# Patient Record
Sex: Female | Born: 1950 | Race: Black or African American | Hispanic: No | Smoking: Never smoker
Health system: Southern US, Community
[De-identification: ages and names within clinical notes are randomized; demographics above are authoritative.]

## PROBLEM LIST (undated history)

## (undated) DIAGNOSIS — I10 Essential (primary) hypertension: Secondary | ICD-10-CM

## (undated) DIAGNOSIS — J45909 Unspecified asthma, uncomplicated: Secondary | ICD-10-CM

## (undated) HISTORY — PX: ABDOMINAL HYSTERECTOMY: SHX81

---

## 2004-05-28 ENCOUNTER — Ambulatory Visit: Payer: Self-pay | Admitting: Internal Medicine

## 2005-08-01 ENCOUNTER — Ambulatory Visit: Payer: Self-pay | Admitting: Internal Medicine

## 2007-01-14 ENCOUNTER — Ambulatory Visit: Payer: Self-pay

## 2008-03-30 ENCOUNTER — Ambulatory Visit: Payer: Self-pay

## 2009-09-03 ENCOUNTER — Emergency Department: Payer: Self-pay | Admitting: Emergency Medicine

## 2009-09-26 ENCOUNTER — Ambulatory Visit: Payer: Self-pay | Admitting: Gastroenterology

## 2009-10-24 ENCOUNTER — Ambulatory Visit: Payer: Self-pay | Admitting: Internal Medicine

## 2012-12-08 ENCOUNTER — Ambulatory Visit: Payer: Self-pay | Admitting: Internal Medicine

## 2014-03-02 ENCOUNTER — Ambulatory Visit: Payer: Self-pay | Admitting: Internal Medicine

## 2014-12-26 ENCOUNTER — Other Ambulatory Visit: Payer: Self-pay | Admitting: Internal Medicine

## 2014-12-26 DIAGNOSIS — Z1231 Encounter for screening mammogram for malignant neoplasm of breast: Secondary | ICD-10-CM

## 2015-01-12 ENCOUNTER — Emergency Department: Payer: Medicare HMO

## 2015-01-12 ENCOUNTER — Emergency Department
Admission: EM | Admit: 2015-01-12 | Discharge: 2015-01-12 | Disposition: A | Discharge status: Home / Self Care | Payer: Medicare HMO | Attending: Emergency Medicine | Admitting: Emergency Medicine

## 2015-01-12 DIAGNOSIS — M1990 Unspecified osteoarthritis, unspecified site: Secondary | ICD-10-CM | POA: Diagnosis not present

## 2015-01-12 DIAGNOSIS — I10 Essential (primary) hypertension: Secondary | ICD-10-CM | POA: Diagnosis not present

## 2015-01-12 DIAGNOSIS — R609 Edema, unspecified: Secondary | ICD-10-CM | POA: Insufficient documentation

## 2015-01-12 DIAGNOSIS — M7989 Other specified soft tissue disorders: Secondary | ICD-10-CM

## 2015-01-12 HISTORY — DX: Essential (primary) hypertension: I10

## 2015-01-12 HISTORY — DX: Unspecified asthma, uncomplicated: J45.909

## 2015-01-12 LAB — CBC
HCT: 39.8 % (ref 35.0–47.0)
Hemoglobin: 12.9 g/dL (ref 12.0–16.0)
MCH: 28.6 pg (ref 26.0–34.0)
MCHC: 32.5 g/dL (ref 32.0–36.0)
MCV: 87.8 fL (ref 80.0–100.0)
Platelets: 322 10*3/uL (ref 150–440)
RBC: 4.53 MIL/uL (ref 3.80–5.20)
RDW: 14.9 % — AB (ref 11.5–14.5)
WBC: 8.1 10*3/uL (ref 3.6–11.0)

## 2015-01-12 LAB — BASIC METABOLIC PANEL
Anion gap: 10 (ref 5–15)
BUN: 12 mg/dL (ref 6–20)
CALCIUM: 9.5 mg/dL (ref 8.9–10.3)
CO2: 30 mmol/L (ref 22–32)
Chloride: 100 mmol/L — ABNORMAL LOW (ref 101–111)
Creatinine, Ser: 0.79 mg/dL (ref 0.44–1.00)
GFR calc Af Amer: >60 mL/min (ref >60)
Glucose, Bld: 119 mg/dL — ABNORMAL HIGH (ref 65–99)
Potassium: 3.9 mmol/L (ref 3.5–5.1)
Sodium: 140 mmol/L (ref 135–145)

## 2015-01-12 MED ORDER — OXYCODONE-ACETAMINOPHEN 5-325 MG PO TABS
2.0000 | ORAL_TABLET | Freq: Once | ORAL | Status: AC
  Administered 2015-01-12: 2 via ORAL
  Filled 2015-01-12: qty 2

## 2015-01-12 MED ORDER — MELOXICAM 7.5 MG PO TABS
7.5000 mg | ORAL_TABLET | Freq: Every day | ORAL | Status: AC
Start: 2015-01-12 — End: 2016-01-12

## 2015-01-12 NOTE — Discharge Instructions (Signed)
Arthritis, Nonspecific °Arthritis is inflammation of a joint. This usually means pain, redness, warmth or swelling are present. One or more joints may be involved. There are a number of types of arthritis. Your caregiver may not be able to tell what type of arthritis you have right away. °CAUSES  °The most common cause of arthritis is the wear and tear on the joint (osteoarthritis). This causes damage to the cartilage, which can break down over time. The knees, hips, back and neck are most often affected by this type of arthritis. °Other types of arthritis and common causes of joint pain include: °· Sprains and other injuries near the joint. Sometimes minor sprains and injuries cause pain and swelling that develop hours later. °· Rheumatoid arthritis. This affects hands, feet and knees. It usually affects both sides of your body at the same time. It is often associated with chronic ailments, fever, weight loss and general weakness. °· Crystal arthritis. Gout and pseudo gout can cause occasional acute severe pain, redness and swelling in the foot, ankle, or knee. °· Infectious arthritis. Bacteria can get into a joint through a break in overlying skin. This can cause infection of the joint. Bacteria and viruses can also spread through the blood and affect your joints. °· Drug, infectious and allergy reactions. Sometimes joints can become mildly painful and slightly swollen with these types of illnesses. °SYMPTOMS  °· Pain is the main symptom. °· Your joint or joints can also be red, swollen and warm or hot to the touch. °· You may have a fever with certain types of arthritis, or even feel overall ill. °· The joint with arthritis will hurt with movement. Stiffness is present with some types of arthritis. °DIAGNOSIS  °Your caregiver will suspect arthritis based on your description of your symptoms and on your exam. Testing may be needed to find the type of arthritis: °· Blood and sometimes urine tests. °· X-ray tests  and sometimes CT or MRI scans. °· Removal of fluid from the joint (arthrocentesis) is done to check for bacteria, crystals or other causes. Your caregiver (or a specialist) will numb the area over the joint with a local anesthetic, and use a needle to remove joint fluid for examination. This procedure is only minimally uncomfortable. °· Even with these tests, your caregiver may not be able to tell what kind of arthritis you have. Consultation with a specialist (rheumatologist) may be helpful. °TREATMENT  °Your caregiver will discuss with you treatment specific to your type of arthritis. If the specific type cannot be determined, then the following general recommendations may apply. °Treatment of severe joint pain includes: °· Rest. °· Elevation. °· Anti-inflammatory medication (for example, ibuprofen) may be prescribed. Avoiding activities that cause increased pain. °· Only take over-the-counter or prescription medicines for pain and discomfort as recommended by your caregiver. °· Cold packs over an inflamed joint may be used for 10 to 15 minutes every hour. Hot packs sometimes feel better, but do not use overnight. Do not use hot packs if you are diabetic without your caregiver's permission. °· A cortisone shot into arthritic joints may help reduce pain and swelling. °· Any acute arthritis that gets worse over the next 1 to 2 days needs to be looked at to be sure there is no joint infection. °Long-term arthritis treatment involves modifying activities and lifestyle to reduce joint stress jarring. This can include weight loss. Also, exercise is needed to nourish the joint cartilage and remove waste. This helps keep the muscles   around the joint strong. °HOME CARE INSTRUCTIONS  °· Do not take aspirin to relieve pain if gout is suspected. This elevates uric acid levels. °· Only take over-the-counter or prescription medicines for pain, discomfort or fever as directed by your caregiver. °· Rest the joint as much as  possible. °· If your joint is swollen, keep it elevated. °· Use crutches if the painful joint is in your leg. °· Drinking plenty of fluids may help for certain types of arthritis. °· Follow your caregiver's dietary instructions. °· Try low-impact exercise such as: °¨ Swimming. °¨ Water aerobics. °¨ Biking. °¨ Walking. °· Morning stiffness is often relieved by a warm shower. °· Put your joints through regular range-of-motion. °SEEK MEDICAL CARE IF:  °· You do not feel better in 24 hours or are getting worse. °· You have side effects to medications, or are not getting better with treatment. °SEEK IMMEDIATE MEDICAL CARE IF:  °· You have a fever. °· You develop severe joint pain, swelling or redness. °· Many joints are involved and become painful and swollen. °· There is severe back pain and/or leg weakness. °· You have loss of bowel or bladder control. °Document Released: 07/25/2004 Document Revised: 09/09/2011 Document Reviewed: 08/10/2008 °[employer]® Patient Information ©2015 [employer]. This information is not intended to replace advice given to you by your health care provider. Make sure you discuss any questions you have with your health care provider. ° °Edema °Edema is an abnormal buildup of fluids. It is more common in your legs and thighs. Painless swelling of the feet and ankles is more likely as a person ages. It also is common in looser skin, like around your eyes. °HOME CARE  °· Keep the affected body part above the level of the heart while lying down. °· Do not sit still or stand for a long time. °· Do not put anything right under your knees when you lie down. °· Do not wear tight clothes on your upper legs. °· Exercise your legs to help the puffiness (swelling) go down. °· Wear elastic bandages or support stockings as told by your doctor. °· A low-salt diet may help lessen the puffiness. °· Only take medicine as told by your doctor. °GET HELP IF: °· Treatment is not working. °· You have heart, liver,  or kidney disease and notice that your skin looks puffy or shiny. °· You have puffiness in your legs that does not get better when you raise your legs. °· You have sudden weight gain for no reason. °GET HELP RIGHT AWAY IF:  °· You have shortness of breath or chest pain. °· You cannot breathe when you lie down. °· You have pain, redness, or warmth in the areas that are puffy. °· You have heart, liver, or kidney disease and get edema all of a sudden. °· You have a fever and your symptoms get worse all of a sudden. °MAKE SURE YOU:  °· Understand these instructions. °· Will watch your condition. °· Will get help right away if you are not doing well or get worse. °Document Released: 12/04/2007 Document Revised: 06/22/2013 Document Reviewed: 04/09/2013 °[employer]Care® Patient Information ©2015 [employer]. This information is not intended to replace advice given to you by your health care provider. Make sure you discuss any questions you have with your health care provider. ° °

## 2015-01-12 NOTE — ED Provider Notes (Signed)
[facility] Emergency Department Provider Note     Time seen: ----------------------------------------- 8:56 PM on 01/12/2015 -----------------------------------------    I have reviewed the triage vital signs and the nursing notes.   HISTORY  Chief Complaint Leg Swelling    HPI Sandra Grant is a 64 y.o. female who presents ER for right knee and right leg swelling with some pain over the last several days. Patient has not had any injury, has been walking more than normal on the concrete floor. She does not have any fever or redness or warmth, does describe some pain to the posterior aspect of the right knee. Patient denies any other complaints.Pain is mild to moderate this time.   Past Medical History  Diagnosis Date  . Asthma   . Hypertension     There are no active problems to display for this patient.   Past Surgical History  Procedure Laterality Date  . Abdominal hysterectomy      Allergies Review of patient's allergies indicates not on file.  Social History History  Substance Use Topics  . Smoking status: Not on file  . Smokeless tobacco: Not on file  . Alcohol Use: No    Review of Systems Constitutional: Negative for fever. Eyes: Negative for visual changes. ENT: Negative for sore throat. Cardiovascular: Negative for chest pain. Respiratory: Negative for shortness of breath. Gastrointestinal: Negative for abdominal pain, vomiting and diarrhea. Genitourinary: Negative for dysuria. Musculoskeletal: Negative for back pain. Positive for right knee and right leg pain and swelling Skin: Negative for rash. Neurological: Negative for headaches, focal weakness or numbness.  10-point ROS otherwise negative.  ____________________________________________   PHYSICAL EXAM:  VITAL SIGNS: ED Triage Vitals  Enc Vitals Group     BP 01/12/15 2041 142/58 mmHg     Pulse Rate 01/12/15 2041 73     Resp --      Temp 01/12/15 2041  98.7 F (37.1 C)     Temp src --      SpO2 01/12/15 2041 97 %     Weight 01/12/15 2041 220 lb (99.791 kg)     Height 01/12/15 2041  (1.549 m)     Head Cir --      Peak Flow --      Pain Score --      Pain Loc --      Pain Edu? --      Excl. in [place]? --     Constitutional: Alert and oriented. Well appearing and in no distress. Eyes: Conjunctivae are normal. PERRL. Normal extraocular movements. ENT   Head: Normocephalic and atraumatic.   Nose: No congestion/rhinnorhea.   Mouth/Throat: Mucous membranes are moist.   Neck: No stridor. Hematological/Lymphatic/Immunilogical: No cervical lymphadenopathy. Cardiovascular: Normal rate, regular rhythm. Normal and symmetric distal pulses are present in all extremities. No murmurs, rubs, or gallops. Respiratory: Normal respiratory effort without tachypnea nor retractions. Breath sounds are clear and equal bilaterally. No wheezes/rales/rhonchi. Gastrointestinal: Soft and nontender. No distention. No abdominal bruits. There is no CVA tenderness. Musculoskeletal: Nontender with normal range of motion in all extremities. No joint effusions.  There is crepitus with range of motion of the right knee. There is also edema diffusely of the right lower extremity compared to the left Neurologic:  Normal speech and language. No gross focal neurologic deficits are appreciated. Speech is normal. No gait instability. Skin:  Skin is warm, dry and intact. No rash noted. Psychiatric: Mood and affect are normal. Speech and behavior are normal.  Patient exhibits appropriate insight and judgment.  ____________________________________________  ED COURSE:  Pertinent labs & imaging results that were available during my care of the patient were reviewed by me and considered in my medical decision making (see chart for details).  ____________________________________________    LABS (pertinent positives/negatives)  Labs Reviewed  CBC - Abnormal;  Notable for the following:    RDW 14.9 (*)    All other components within normal limits  BASIC METABOLIC PANEL - Abnormal; Notable for the following:    Chloride 100 (*)    Glucose, Bld 119 (*)    All other components within normal limits    RADIOLOGY Images were viewed by me  Ultrasound of the right lower extremity is unremarkable.  Right knee x-rays IMPRESSION: 1. No fracture or acute finding. 2. Minor degenerative change. 3. Small joint effusion.  ____________________________________________  FINAL ASSESSMENT AND PLAN  Edema, osteoarthritis  Plan: Patient with labs and imaging as dictated above. Patient be just discharged home with anti-inflammatory medication for her arthritis, referred to orthopedics for follow-up. The edema in her leg is chronic, as no DVT. Her pain is due to arthritis which has been exacerbated by increased activity.   Emily FilbertWilliams, Schylar Wuebker E, MD   Emily FilbertJonathan E Uchenna Rappaport, MD 01/12/15 [phone]

## 2015-01-12 NOTE — ED Notes (Signed)
ACE wrap applied to the right knee per MD orders.

## 2015-01-12 NOTE — ED Notes (Signed)
Noticed right knee and lower leg to foot swelling with some pain.  States no injury just started swelling. Normal temperature, no redness or warmth. Able to bear weight, states it hurts. Pulses intact, motor/sensation intact.

## 2015-04-05 ENCOUNTER — Other Ambulatory Visit: Payer: Self-pay | Admitting: Internal Medicine

## 2015-04-05 ENCOUNTER — Ambulatory Visit
Admission: RE | Admit: 2015-04-05 | Discharge: 2015-04-05 | Disposition: A | Discharge status: Home / Self Care | Payer: Commercial Managed Care - HMO | Source: Ambulatory Visit | Attending: Internal Medicine | Admitting: Internal Medicine

## 2015-04-05 DIAGNOSIS — Z1231 Encounter for screening mammogram for malignant neoplasm of breast: Secondary | ICD-10-CM

## 2015-04-05 DIAGNOSIS — R922 Inconclusive mammogram: Secondary | ICD-10-CM | POA: Diagnosis not present

## 2015-04-07 ENCOUNTER — Other Ambulatory Visit: Payer: Self-pay | Admitting: Internal Medicine

## 2015-04-07 DIAGNOSIS — R928 Other abnormal and inconclusive findings on diagnostic imaging of breast: Secondary | ICD-10-CM

## 2015-04-19 ENCOUNTER — Ambulatory Visit
Admission: RE | Admit: 2015-04-19 | Discharge: 2015-04-19 | Disposition: A | Discharge status: Home / Self Care | Payer: Medicare HMO | Source: Ambulatory Visit | Attending: Internal Medicine | Admitting: Internal Medicine

## 2015-04-19 DIAGNOSIS — N6001 Solitary cyst of right breast: Secondary | ICD-10-CM | POA: Insufficient documentation

## 2015-04-19 DIAGNOSIS — R928 Other abnormal and inconclusive findings on diagnostic imaging of breast: Secondary | ICD-10-CM | POA: Insufficient documentation

## 2015-12-20 ENCOUNTER — Other Ambulatory Visit: Payer: Self-pay | Admitting: Internal Medicine

## 2015-12-20 DIAGNOSIS — R053 Chronic cough: Secondary | ICD-10-CM

## 2015-12-20 DIAGNOSIS — R05 Cough: Secondary | ICD-10-CM

## 2015-12-25 ENCOUNTER — Other Ambulatory Visit: Payer: Self-pay | Admitting: Internal Medicine

## 2015-12-25 DIAGNOSIS — Z1231 Encounter for screening mammogram for malignant neoplasm of breast: Secondary | ICD-10-CM

## 2016-01-16 ENCOUNTER — Ambulatory Visit
Admission: RE | Admit: 2016-01-16 | Discharge: 2016-01-16 | Disposition: A | Discharge status: Home / Self Care | Payer: Medicare HMO | Source: Ambulatory Visit | Attending: Internal Medicine | Admitting: Internal Medicine

## 2016-01-16 DIAGNOSIS — R05 Cough: Secondary | ICD-10-CM | POA: Insufficient documentation

## 2016-01-16 DIAGNOSIS — R053 Chronic cough: Secondary | ICD-10-CM

## 2016-04-05 ENCOUNTER — Other Ambulatory Visit: Payer: Self-pay | Admitting: Internal Medicine

## 2016-04-05 ENCOUNTER — Ambulatory Visit
Admission: RE | Admit: 2016-04-05 | Discharge: 2016-04-05 | Disposition: A | Discharge status: Home / Self Care | Payer: Medicare HMO | Source: Ambulatory Visit | Attending: Internal Medicine | Admitting: Internal Medicine

## 2016-04-05 DIAGNOSIS — Z1231 Encounter for screening mammogram for malignant neoplasm of breast: Secondary | ICD-10-CM

## 2017-01-16 DIAGNOSIS — I10 Essential (primary) hypertension: Secondary | ICD-10-CM | POA: Diagnosis not present

## 2017-01-16 DIAGNOSIS — Z83511 Family history of glaucoma: Secondary | ICD-10-CM | POA: Diagnosis not present

## 2017-01-16 DIAGNOSIS — H5203 Hypermetropia, bilateral: Secondary | ICD-10-CM | POA: Diagnosis not present

## 2017-01-16 DIAGNOSIS — H43813 Vitreous degeneration, bilateral: Secondary | ICD-10-CM | POA: Diagnosis not present

## 2017-01-16 DIAGNOSIS — E7800 Pure hypercholesterolemia, unspecified: Secondary | ICD-10-CM | POA: Diagnosis not present

## 2017-01-16 DIAGNOSIS — H52222 Regular astigmatism, left eye: Secondary | ICD-10-CM | POA: Diagnosis not present

## 2017-01-16 DIAGNOSIS — H524 Presbyopia: Secondary | ICD-10-CM | POA: Diagnosis not present

## 2017-01-16 DIAGNOSIS — H2513 Age-related nuclear cataract, bilateral: Secondary | ICD-10-CM | POA: Diagnosis not present

## 2017-02-11 DIAGNOSIS — Z79899 Other long term (current) drug therapy: Secondary | ICD-10-CM | POA: Diagnosis not present

## 2017-02-11 DIAGNOSIS — E7800 Pure hypercholesterolemia, unspecified: Secondary | ICD-10-CM | POA: Diagnosis not present

## 2017-02-11 DIAGNOSIS — R739 Hyperglycemia, unspecified: Secondary | ICD-10-CM | POA: Diagnosis not present

## 2017-02-11 DIAGNOSIS — I10 Essential (primary) hypertension: Secondary | ICD-10-CM | POA: Diagnosis not present

## 2017-02-18 ENCOUNTER — Other Ambulatory Visit: Payer: Self-pay | Admitting: Internal Medicine

## 2017-02-18 DIAGNOSIS — M7989 Other specified soft tissue disorders: Secondary | ICD-10-CM

## 2017-02-18 DIAGNOSIS — Z1231 Encounter for screening mammogram for malignant neoplasm of breast: Secondary | ICD-10-CM | POA: Diagnosis not present

## 2017-02-18 DIAGNOSIS — I10 Essential (primary) hypertension: Secondary | ICD-10-CM | POA: Diagnosis not present

## 2017-02-18 DIAGNOSIS — Z0000 Encounter for general adult medical examination without abnormal findings: Secondary | ICD-10-CM | POA: Diagnosis not present

## 2017-02-18 DIAGNOSIS — Z13820 Encounter for screening for osteoporosis: Secondary | ICD-10-CM | POA: Diagnosis not present

## 2017-02-18 DIAGNOSIS — E7800 Pure hypercholesterolemia, unspecified: Secondary | ICD-10-CM | POA: Diagnosis not present

## 2017-02-18 DIAGNOSIS — Z79899 Other long term (current) drug therapy: Secondary | ICD-10-CM | POA: Diagnosis not present

## 2017-02-18 DIAGNOSIS — K219 Gastro-esophageal reflux disease without esophagitis: Secondary | ICD-10-CM | POA: Diagnosis not present

## 2017-02-18 DIAGNOSIS — R739 Hyperglycemia, unspecified: Secondary | ICD-10-CM | POA: Diagnosis not present

## 2017-02-21 ENCOUNTER — Ambulatory Visit
Admission: RE | Admit: 2017-02-21 | Discharge: 2017-02-21 | Disposition: A | Discharge status: Home / Self Care | Payer: Medicare HMO | Source: Ambulatory Visit | Attending: Internal Medicine | Admitting: Internal Medicine

## 2017-02-21 DIAGNOSIS — M7989 Other specified soft tissue disorders: Secondary | ICD-10-CM | POA: Diagnosis not present

## 2017-02-26 DIAGNOSIS — M8588 Other specified disorders of bone density and structure, other site: Secondary | ICD-10-CM | POA: Diagnosis not present

## 2017-03-17 ENCOUNTER — Other Ambulatory Visit: Payer: Self-pay | Admitting: Internal Medicine

## 2017-03-17 DIAGNOSIS — Z1231 Encounter for screening mammogram for malignant neoplasm of breast: Secondary | ICD-10-CM

## 2017-04-08 ENCOUNTER — Ambulatory Visit
Admission: RE | Admit: 2017-04-08 | Discharge: 2017-04-08 | Disposition: A | Discharge status: Home / Self Care | Payer: Medicare HMO | Source: Ambulatory Visit | Attending: Internal Medicine | Admitting: Internal Medicine

## 2017-04-08 DIAGNOSIS — Z1231 Encounter for screening mammogram for malignant neoplasm of breast: Secondary | ICD-10-CM | POA: Diagnosis not present

## 2017-05-14 DIAGNOSIS — M159 Polyosteoarthritis, unspecified: Secondary | ICD-10-CM | POA: Diagnosis not present

## 2017-05-14 DIAGNOSIS — R05 Cough: Secondary | ICD-10-CM | POA: Diagnosis not present

## 2017-05-14 DIAGNOSIS — Z0000 Encounter for general adult medical examination without abnormal findings: Secondary | ICD-10-CM | POA: Diagnosis not present

## 2018-04-02 ENCOUNTER — Other Ambulatory Visit: Payer: Self-pay | Admitting: Internal Medicine

## 2018-04-02 DIAGNOSIS — Z1231 Encounter for screening mammogram for malignant neoplasm of breast: Secondary | ICD-10-CM

## 2018-04-23 ENCOUNTER — Ambulatory Visit
Admission: RE | Admit: 2018-04-23 | Discharge: 2018-04-23 | Disposition: A | Discharge status: Home / Self Care | Payer: Medicare HMO | Source: Ambulatory Visit | Attending: Internal Medicine | Admitting: Internal Medicine

## 2018-04-23 DIAGNOSIS — Z1231 Encounter for screening mammogram for malignant neoplasm of breast: Secondary | ICD-10-CM | POA: Diagnosis present

## 2019-04-05 ENCOUNTER — Other Ambulatory Visit: Payer: Self-pay | Admitting: Internal Medicine

## 2019-04-05 DIAGNOSIS — Z1231 Encounter for screening mammogram for malignant neoplasm of breast: Secondary | ICD-10-CM

## 2019-05-14 ENCOUNTER — Ambulatory Visit
Admission: RE | Admit: 2019-05-14 | Discharge: 2019-05-14 | Disposition: A | Discharge status: Home / Self Care | Payer: Medicare HMO | Source: Ambulatory Visit | Attending: Internal Medicine | Admitting: Internal Medicine

## 2019-05-14 DIAGNOSIS — Z1231 Encounter for screening mammogram for malignant neoplasm of breast: Secondary | ICD-10-CM | POA: Insufficient documentation

## 2019-05-20 ENCOUNTER — Other Ambulatory Visit: Payer: Self-pay

## 2019-05-20 DIAGNOSIS — Z20822 Contact with and (suspected) exposure to covid-19: Secondary | ICD-10-CM

## 2019-05-22 ENCOUNTER — Telehealth: Payer: Self-pay

## 2019-05-22 LAB — NOVEL CORONAVIRUS, NAA: SARS-CoV-2, NAA: NOT DETECTED

## 2019-05-22 NOTE — Telephone Encounter (Signed)
Pt called for covid test results. Pt informed results not back. Pt refused MyChart.

## 2019-05-24 ENCOUNTER — Telehealth: Payer: Self-pay | Admitting: Internal Medicine

## 2019-05-24 NOTE — Telephone Encounter (Signed)
Negative COVID results given. Patient results "NOT Detected." Caller expressed understanding. ° °

## 2019-07-19 ENCOUNTER — Ambulatory Visit
Admission: EM | Admit: 2019-07-19 | Discharge: 2019-07-19 | Disposition: A | Discharge status: Home / Self Care | Payer: Medicare HMO | Attending: Emergency Medicine | Admitting: Emergency Medicine

## 2019-07-19 ENCOUNTER — Other Ambulatory Visit: Payer: Self-pay

## 2019-07-19 DIAGNOSIS — Z20822 Contact with and (suspected) exposure to covid-19: Secondary | ICD-10-CM | POA: Insufficient documentation

## 2019-07-19 NOTE — ED Provider Notes (Signed)
[facility] ____________________________________________  Time seen: Approximately 1:06 PM  I have reviewed the triage vital signs and the nursing notes.   HISTORY  Chief Complaint covid exposure   HPI Sandra Grant is a 69 y.o. female presenting for COVID-19 testing.  Patient reports she was around her nephew this past week that subsequently tested positive for COVID-19.  Was last around last Wednesday.  Denies cough, congestion, sore throat, fevers, vomiting, diarrhea, chest pain, shortness of breath or changes in taste or smell.  States feels well.  Denies aggravating or alleviating factors.   Past Medical History:  Diagnosis Date  . Asthma   . Hypertension     There are no problems to display for this patient.   Past Surgical History:  Procedure Laterality Date  . ABDOMINAL HYSTERECTOMY       No current facility-administered medications for this encounter.  Current Outpatient Medications:  .  furosemide (LASIX) 40 MG tablet, Take by mouth., Disp: , Rfl:  .  losartan (COZAAR) 100 MG tablet, Take by mouth., Disp: , Rfl:  .  aspirin 325 MG tablet, Take by mouth., Disp: , Rfl:  .  budesonide-formoterol (SYMBICORT) 160-4.5 MCG/ACT inhaler, Inhale into the lungs., Disp: , Rfl:  .  famotidine (PEPCID) 20 MG tablet, Take 40 mg by mouth at bedtime., Disp: , Rfl:  .  HYDROcodone-acetaminophen (NORCO/VICODIN) 5-325 MG tablet, Take by mouth., Disp: , Rfl:  .  methocarbamol (ROBAXIN) 500 MG tablet, Take 500 mg by mouth 2 (two) times daily as needed., Disp: , Rfl:   Allergies Sulfa antibiotics  Family History  Problem Relation Age of Onset  . Breast cancer Paternal Aunt   . Breast cancer Cousin     Social History Social History   Tobacco Use  . Smoking status: Never Smoker  . Smokeless tobacco: Never Used  Substance Use Topics  . Alcohol use: No  . Drug use: Not on file    Review of Systems Constitutional: No fever. ENT: No sore  throat. Cardiovascular: Denies chest pain. Respiratory: Denies shortness of breath. Gastrointestinal: No abdominal pain.  No nausea, no vomiting.  No diarrhea.   Musculoskeletal: Negative for back pain. Skin: Negative for rash.  ____________________________________________   PHYSICAL EXAM:  VITAL SIGNS: ED Triage Vitals  Enc Vitals Group     BP 07/19/19 1203 (!) 158/73     Pulse Rate 07/19/19 1203 76     Resp 07/19/19 1203 17     Temp 07/19/19 1203 98.6 F (37 C)     Temp Source 07/19/19 1203 Oral     SpO2 07/19/19 1203 99 %     Weight 07/19/19 1158 204 lb (92.5 kg)     Height 07/19/19 1158 5\' 2"  (1.575 m)     Head Circumference --      Peak Flow --      Pain Score 07/19/19 1158 0     Pain Loc --      Pain Edu? --      Excl. in [place]? --     Constitutional: Alert and oriented. Well appearing and in no acute distress. Eyes: Conjunctivae are normal.  ENT      Head: Normocephalic and atraumatic. Cardiovascular: Normal rate, regular rhythm. Grossly normal heart sounds.  Good peripheral circulation. Respiratory: Normal respiratory effort without tachypnea nor retractions. Breath sounds are clear and equal bilaterally. No wheezes, rales, rhonchi. Musculoskeletal:  Steady gait.  Neurologic:  Normal speech and language. Speech is normal. No gait instability.  Skin:  Skin is warm, dry and intact. No rash noted. Psychiatric: Mood and affect are normal. Speech and behavior are normal. Patient exhibits appropriate insight and judgment   ___________________________________________   LABS (all labs ordered are listed, but only abnormal results are displayed)  Labs Reviewed  NOVEL CORONAVIRUS, NAA (HOSP ORDER, SEND-OUT TO REF LAB; TAT 18-24 HRS)     PROCEDURES Procedures     INITIAL IMPRESSION / ASSESSMENT AND PLAN / ED COURSE  Pertinent labs & imaging results that were available during my care of the patient were reviewed by me and considered in my medical decision making  (see chart for details).  Well appearing patient. Asymptomatic.  COVID-19 exposure.  COVID-19 testing completed and advice given.  Monitor.  Discussed follow up and return parameters including no resolution or any worsening concerns. Patient verbalized understanding and agreed to plan.   ____________________________________________   FINAL CLINICAL IMPRESSION(S) / ED DIAGNOSES  Final diagnoses:  Encounter for screening laboratory testing for COVID-19 virus  Exposure to COVID-19 virus     ED Discharge Orders    None       Note: This dictation was prepared with Dragon dictation along with smaller phrase technology. Any transcriptional errors that result from this process are unintentional.         Renford Dills, NP 07/19/19 1308

## 2019-07-19 NOTE — ED Triage Notes (Signed)
Pt reports she had exposure to her nephew on Wednesday and then he tested positive for COVID on Friday. She is asymptomatic

## 2019-07-20 LAB — NOVEL CORONAVIRUS, NAA (HOSP ORDER, SEND-OUT TO REF LAB; TAT 18-24 HRS): SARS-CoV-2, NAA: NOT DETECTED

## 2020-04-20 ENCOUNTER — Other Ambulatory Visit: Payer: Self-pay | Admitting: Internal Medicine

## 2020-04-20 DIAGNOSIS — Z1231 Encounter for screening mammogram for malignant neoplasm of breast: Secondary | ICD-10-CM

## 2020-05-24 ENCOUNTER — Ambulatory Visit
Admission: RE | Admit: 2020-05-24 | Discharge: 2020-05-24 | Disposition: A | Discharge status: Home / Self Care | Payer: Medicare HMO | Source: Ambulatory Visit | Attending: Internal Medicine | Admitting: Internal Medicine

## 2020-05-24 ENCOUNTER — Other Ambulatory Visit: Payer: Self-pay

## 2020-05-24 DIAGNOSIS — Z1231 Encounter for screening mammogram for malignant neoplasm of breast: Secondary | ICD-10-CM | POA: Diagnosis present

## 2021-04-17 ENCOUNTER — Other Ambulatory Visit: Payer: Self-pay | Admitting: Internal Medicine

## 2021-04-17 DIAGNOSIS — Z1231 Encounter for screening mammogram for malignant neoplasm of breast: Secondary | ICD-10-CM

## 2021-05-28 ENCOUNTER — Other Ambulatory Visit: Payer: Self-pay

## 2021-05-28 ENCOUNTER — Ambulatory Visit
Admission: RE | Admit: 2021-05-28 | Discharge: 2021-05-28 | Disposition: A | Discharge status: Home / Self Care | Payer: Medicare HMO | Source: Ambulatory Visit | Attending: Internal Medicine | Admitting: Internal Medicine

## 2021-05-28 DIAGNOSIS — Z1231 Encounter for screening mammogram for malignant neoplasm of breast: Secondary | ICD-10-CM

## 2022-04-18 ENCOUNTER — Other Ambulatory Visit: Payer: Self-pay | Admitting: Internal Medicine

## 2022-04-18 DIAGNOSIS — Z1231 Encounter for screening mammogram for malignant neoplasm of breast: Secondary | ICD-10-CM

## 2022-07-22 ENCOUNTER — Ambulatory Visit
Admission: RE | Admit: 2022-07-22 | Discharge: 2022-07-22 | Disposition: A | Discharge status: Home / Self Care | Payer: Medicare HMO | Source: Ambulatory Visit | Attending: Internal Medicine | Admitting: Internal Medicine

## 2022-07-22 DIAGNOSIS — Z1231 Encounter for screening mammogram for malignant neoplasm of breast: Secondary | ICD-10-CM | POA: Diagnosis not present

## 2022-09-23 ENCOUNTER — Encounter: Payer: Self-pay | Admitting: Emergency Medicine

## 2022-09-24 ENCOUNTER — Ambulatory Visit
Admission: RE | Admit: 2022-09-24 | Discharge: 2022-09-24 | Disposition: A | Discharge status: Home / Self Care | Payer: Medicare HMO | Attending: Gastroenterology | Admitting: Gastroenterology

## 2022-09-24 ENCOUNTER — Ambulatory Visit: Payer: Medicare HMO | Admitting: Anesthesiology

## 2022-09-24 ENCOUNTER — Encounter
Admission: RE | Disposition: A | Discharge status: Home / Self Care | Payer: Self-pay | Source: Home / Self Care | Attending: Gastroenterology

## 2022-09-24 ENCOUNTER — Encounter: Payer: Self-pay | Admitting: *Deleted

## 2022-09-24 DIAGNOSIS — K640 First degree hemorrhoids: Secondary | ICD-10-CM | POA: Diagnosis not present

## 2022-09-24 DIAGNOSIS — Z1211 Encounter for screening for malignant neoplasm of colon: Secondary | ICD-10-CM | POA: Diagnosis not present

## 2022-09-24 DIAGNOSIS — I10 Essential (primary) hypertension: Secondary | ICD-10-CM | POA: Diagnosis not present

## 2022-09-24 DIAGNOSIS — E669 Obesity, unspecified: Secondary | ICD-10-CM | POA: Insufficient documentation

## 2022-09-24 DIAGNOSIS — Z6841 Body Mass Index (BMI) 40.0 and over, adult: Secondary | ICD-10-CM | POA: Insufficient documentation

## 2022-09-24 DIAGNOSIS — K5730 Diverticulosis of large intestine without perforation or abscess without bleeding: Secondary | ICD-10-CM | POA: Insufficient documentation

## 2022-09-24 HISTORY — PX: COLONOSCOPY WITH PROPOFOL: SHX5780

## 2022-09-24 SURGERY — COLONOSCOPY WITH PROPOFOL
Anesthesia: General

## 2022-09-24 MED ORDER — PROPOFOL 500 MG/50ML IV EMUL
INTRAVENOUS | Status: DC | PRN
  Administered 2022-09-24: 75 ug/kg/min via INTRAVENOUS

## 2022-09-24 MED ORDER — LIDOCAINE HCL (PF) 2 % IJ SOLN
INTRAMUSCULAR | Status: AC
  Filled 2022-09-24: qty 5

## 2022-09-24 MED ORDER — LIDOCAINE HCL (CARDIAC) PF 100 MG/5ML IV SOSY
PREFILLED_SYRINGE | INTRAVENOUS | Status: DC | PRN
  Administered 2022-09-24: 80 mg via INTRAVENOUS

## 2022-09-24 MED ORDER — SODIUM CHLORIDE 0.9 % IV SOLN: INTRAVENOUS | Status: DC

## 2022-09-24 MED ORDER — PROPOFOL 10 MG/ML IV BOLUS
INTRAVENOUS | Status: DC | PRN
  Administered 2022-09-24: 50 mg via INTRAVENOUS

## 2022-09-24 NOTE — Anesthesia Preprocedure Evaluation (Addendum)
Anesthesia Evaluation  Patient identified by MRN, date of birth, ID band Patient awake    Reviewed: Allergy & Precautions, H&P , NPO status , Patient's Chart, lab work & pertinent test results  Airway Mallampati: III  TM Distance: >3 FB Neck ROM: full    Dental  (+) Missing, Implants   Pulmonary neg pulmonary ROS   Pulmonary exam normal        Cardiovascular hypertension, Normal cardiovascular exam     Neuro/Psych negative neurological ROS  negative psych ROS   GI/Hepatic negative GI ROS, Neg liver ROS,,,  Endo/Other  negative endocrine ROS    Renal/GU negative Renal ROS  negative genitourinary   Musculoskeletal   Abdominal  (+) + obese  Peds  Hematology negative hematology ROS (+)   Anesthesia Other Findings Past Medical History: No date: Asthma No date: Hypertension  Past Surgical History: No date: ABDOMINAL HYSTERECTOMY     Reproductive/Obstetrics negative OB ROS                             Anesthesia Physical Anesthesia Plan  ASA: 2  Anesthesia Plan: General   Post-op Pain Management: Minimal or no pain anticipated   Induction: Intravenous  PONV Risk Score and Plan: Propofol infusion and TIVA  Airway Management Planned: Natural Airway  Additional Equipment:   Intra-op Plan:   Post-operative Plan:   Informed Consent: I have reviewed the patients History and Physical, chart, labs and discussed the procedure including the risks, benefits and alternatives for the proposed anesthesia with the patient or authorized representative who has indicated his/her understanding and acceptance.     Dental Advisory Given  Plan Discussed with: CRNA and Surgeon  Anesthesia Plan Comments:        Anesthesia Quick Evaluation

## 2022-09-24 NOTE — Transfer of Care (Signed)
Immediate Anesthesia Transfer of Care Note  Patient: Sandra Grant  Procedure(s) Performed: COLONOSCOPY WITH PROPOFOL  Patient Location: PACU  Anesthesia Type:General  Level of Consciousness: sedated  Airway & Oxygen Therapy: Patient Spontanous Breathing and Patient connected to nasal cannula oxygen  Post-op Assessment: Report given to RN and Post -op Vital signs reviewed and stable  Post vital signs: Reviewed and stable  Last Vitals:  Vitals Value Taken Time  BP 112/54 09/24/22 1008  Temp 35.6 C 09/24/22 1008  Pulse 49 09/24/22 1009  Resp 17 09/24/22 1009  SpO2 98 % 09/24/22 1009  Vitals shown include unvalidated device data.  Last Pain:  Vitals:   09/24/22 1008  TempSrc: Temporal  PainSc: Asleep         Complications: No notable events documented.

## 2022-09-24 NOTE — Op Note (Signed)
[facility] Patient Name: Sandra Grant Procedure Date: 09/24/2022 9:32 AM MRN: [id number] Account #: [id number] Date of Birth: 11/25/1950 Admit Type: Outpatient Age: 72 Room: [facility] ENDO ROOM 1 Gender: Female Note Status: Finalized Instrument Name: Jasper Riling [id number] Procedure:             Colonoscopy Indications:           Screening for colorectal malignant neoplasm Providers:             Andrey Farmer MD, MD Medicines:             Monitored Anesthesia Care Complications:         No immediate complications. Estimated blood loss:                         Minimal. Procedure:             Pre-Anesthesia Assessment:                        - Prior to the procedure, a History and Physical was                         performed, and patient medications and allergies were                         reviewed. The patient is competent. The risks and                         benefits of the procedure and the sedation options and                         risks were discussed with the patient. All questions                         were answered and informed consent was obtained.                         Patient identification and proposed procedure were                         verified by the physician, the nurse, the                         anesthesiologist, the anesthetist and the technician                         in the endoscopy suite. Mental Status Examination:                         alert and oriented. Airway Examination: normal                         oropharyngeal airway and neck mobility. Respiratory                         Examination: clear to auscultation. CV Examination:                         normal. Prophylactic Antibiotics: The patient does not  require prophylactic antibiotics. Prior                         Anticoagulants: The patient has taken no anticoagulant                         or antiplatelet agents. ASA Grade  Assessment: III - A                         patient with severe systemic disease. After reviewing                         the risks and benefits, the patient was deemed in                         satisfactory condition to undergo the procedure. The                         anesthesia plan was to use monitored anesthesia care                         (MAC). Immediately prior to administration of                         medications, the patient was re-assessed for adequacy                         to receive sedatives. The heart rate, respiratory                         rate, oxygen saturations, blood pressure, adequacy of                         pulmonary ventilation, and response to care were                         monitored throughout the procedure. The physical                         status of the patient was re-assessed after the                         procedure.                        After obtaining informed consent, the colonoscope was                         passed under direct vision. Throughout the procedure,                         the patient's blood pressure, pulse, and oxygen                         saturations were monitored continuously. The                         Colonoscope was introduced through the anus and  advanced to the the cecum, identified by appendiceal                         orifice and ileocecal valve. The colonoscopy was                         somewhat difficult due to significant looping.                         Successful completion of the procedure was aided by                         applying abdominal pressure. The patient tolerated the                         procedure well. The quality of the bowel preparation                         was good. The ileocecal valve, appendiceal orifice,                         and rectum were photographed. Findings:      The perianal and digital rectal examinations were normal.      Scattered  large-mouthed and small-mouthed diverticula were found in the       sigmoid colon, descending colon, transverse colon and ascending colon.      Internal hemorrhoids were found during retroflexion. The hemorrhoids       were Grade I (internal hemorrhoids that do not prolapse).      The exam was otherwise without abnormality on direct and retroflexion       views. Impression:            - Diverticulosis in the sigmoid colon, in the                         descending colon, in the transverse colon and in the                         ascending colon.                        - Internal hemorrhoids.                        - The examination was otherwise normal on direct and                         retroflexion views.                        - No specimens collected. Recommendation:        - Discharge patient to home.                        - Resume previous diet.                        - Continue present medications.                        - Repeat colonoscopy  is not recommended due to current                         age (75 years or older) for screening purposes.                        - Return to referring physician as previously                         scheduled. Procedure Code(s):     --- Professional ---                        [id number], Colorectal cancer screening; colonoscopy on                         individual not meeting criteria for high risk Diagnosis Code(s):     --- Professional ---                        Z12.11, Encounter for screening for malignant neoplasm                         of colon                        K64.0, First degree hemorrhoids                        K57.30, Diverticulosis of large intestine without                         perforation or abscess without bleeding CPT copyright 2022 [employer]. All rights reserved. The codes documented in this report are preliminary and upon coder review may  be revised to meet current compliance  requirements. Andrey Farmer MD, MD 09/24/2022 10:06:46 AM Number of Addenda: 0 Note Initiated On: 09/24/2022 9:32 AM Scope Withdrawal Time: 0 hours 5 minutes 25 seconds  Total Procedure Duration: 0 hours 13 minutes 4 seconds  Estimated Blood Loss:  Estimated blood loss was minimal.      [facility]

## 2022-09-24 NOTE — H&P (Signed)
Outpatient short stay form Pre-procedure 09/24/2022  Sandra Rubenstein, MD  Primary Physician: Idelle Crouch, MD  Reason for visit:  Screening  History of present illness:    72 y/o lady with history of hypertension and obesity here for screening colonoscopy. Had a normal colonoscopy in 2011. No family history of GI malignancies. No blood thinners except aspirin. History of hysterectomy.    Current Facility-Administered Medications:    0.9 %  sodium chloride infusion, , Intravenous, Continuous, Baudelia Schroepfer, Hilton Cork, MD, Last Rate: 20 mL/hr at 09/24/22 0906, New Bag at 09/24/22 0906  Medications Prior to Admission  Medication Sig Dispense Refill Last Dose   budesonide-formoterol (SYMBICORT) 160-4.5 MCG/ACT inhaler Inhale into the lungs.   Past Week   famotidine (PEPCID) 20 MG tablet Take 40 mg by mouth at bedtime.   09/23/2022   furosemide (LASIX) 40 MG tablet Take by mouth.   09/23/2022   losartan (COZAAR) 100 MG tablet Take by mouth.   09/23/2022   methocarbamol (ROBAXIN) 500 MG tablet Take 500 mg by mouth 2 (two) times daily as needed.   09/23/2022   aspirin 325 MG tablet Take 81 mg by mouth daily.   09/22/2022   HYDROcodone-acetaminophen (NORCO/VICODIN) 5-325 MG tablet Take by mouth. (Patient not taking: Reported on 09/24/2022)   Not Taking     Allergies  Allergen Reactions   Sulfa Antibiotics Rash     Past Medical History:  Diagnosis Date   Asthma    Hypertension     Review of systems:  Otherwise negative.    Physical Exam  Gen: Alert, oriented. Appears stated age.  HEENT: PERRLA. Lungs: No respiratory distress CV: RRR Abd: soft, benign, no masses Ext: No edema    Planned procedures: Proceed with colonoscopy. The patient understands the nature of the planned procedure, indications, risks, alternatives and potential complications including but not limited to bleeding, infection, perforation, damage to internal organs and possible oversedation/side effects from  anesthesia. The patient agrees and gives consent to proceed.  Please refer to procedure notes for findings, recommendations and patient disposition/instructions.     Sandra Rubenstein, MD [facility]

## 2022-09-24 NOTE — Interval H&P Note (Signed)
History and Physical Interval Note:  09/24/2022 9:42 AM  Arthor Captain  has presented today for surgery, with the diagnosis of CCA SCREEN.  The various methods of treatment have been discussed with the patient and family. After consideration of risks, benefits and other options for treatment, the patient has consented to  Procedure(s): COLONOSCOPY WITH PROPOFOL (N/A) as a surgical intervention.  The patient's history has been reviewed, patient examined, no change in status, stable for surgery.  I have reviewed the patient's chart and labs.  Questions were answered to the patient's satisfaction.     Sandra Grant  Ok to proceed with colonoscopy

## 2022-09-25 NOTE — Anesthesia Postprocedure Evaluation (Signed)
Anesthesia Post Note  Patient: Sandra Grant  Procedure(s) Performed: COLONOSCOPY WITH PROPOFOL  Patient location during evaluation: PACU Anesthesia Type: General Level of consciousness: awake and alert Pain management: pain level controlled Vital Signs Assessment: post-procedure vital signs reviewed and stable Respiratory status: spontaneous breathing, nonlabored ventilation and respiratory function stable Cardiovascular status: blood pressure returned to baseline and stable Postop Assessment: no apparent nausea or vomiting Anesthetic complications: no   No notable events documented.   Last Vitals:  Vitals:   09/24/22 1018 09/24/22 1028  BP: 129/62 (!) 152/71  Pulse: (!) 50 (!) 53  Resp: 18 17  Temp:    SpO2: 100% 97%    Last Pain:  Vitals:   09/25/22 0731  TempSrc:   PainSc: 0-No pain                 [place] Ouch

## 2022-09-26 ENCOUNTER — Encounter: Payer: Self-pay | Admitting: Gastroenterology

## 2023-06-09 ENCOUNTER — Other Ambulatory Visit: Payer: Self-pay | Admitting: Internal Medicine

## 2023-06-09 DIAGNOSIS — Z1231 Encounter for screening mammogram for malignant neoplasm of breast: Secondary | ICD-10-CM

## 2023-07-24 ENCOUNTER — Ambulatory Visit
Admission: RE | Admit: 2023-07-24 | Discharge: 2023-07-24 | Disposition: A | Discharge status: Home / Self Care | Payer: No Typology Code available for payment source | Source: Ambulatory Visit | Attending: Internal Medicine | Admitting: Internal Medicine

## 2023-07-24 DIAGNOSIS — Z1231 Encounter for screening mammogram for malignant neoplasm of breast: Secondary | ICD-10-CM | POA: Insufficient documentation

## 2023-08-08 IMAGING — MG MM DIGITAL SCREENING BILAT W/ TOMO AND CAD
8 series · 8 of 24 positions shown · non-contrast
Comparison: Previous exam(s).

CLINICAL DATA: Screening.

EXAM:
DIGITAL SCREENING BILATERAL MAMMOGRAM WITH TOMOSYNTHESIS AND CAD
TECHNIQUE: Bilateral screening digital craniocaudal and mediolateral oblique
mammograms were obtained. Bilateral screening digital breast
tomosynthesis was performed. The images were evaluated with
computer-aided detection.

[L CC synth-2D]
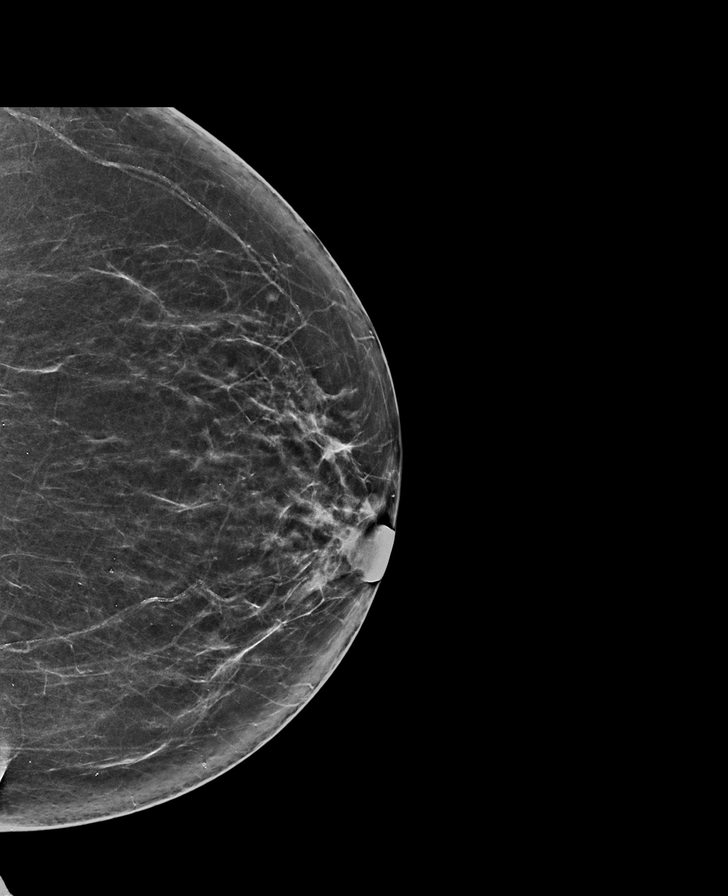

[R CC synth-2D]
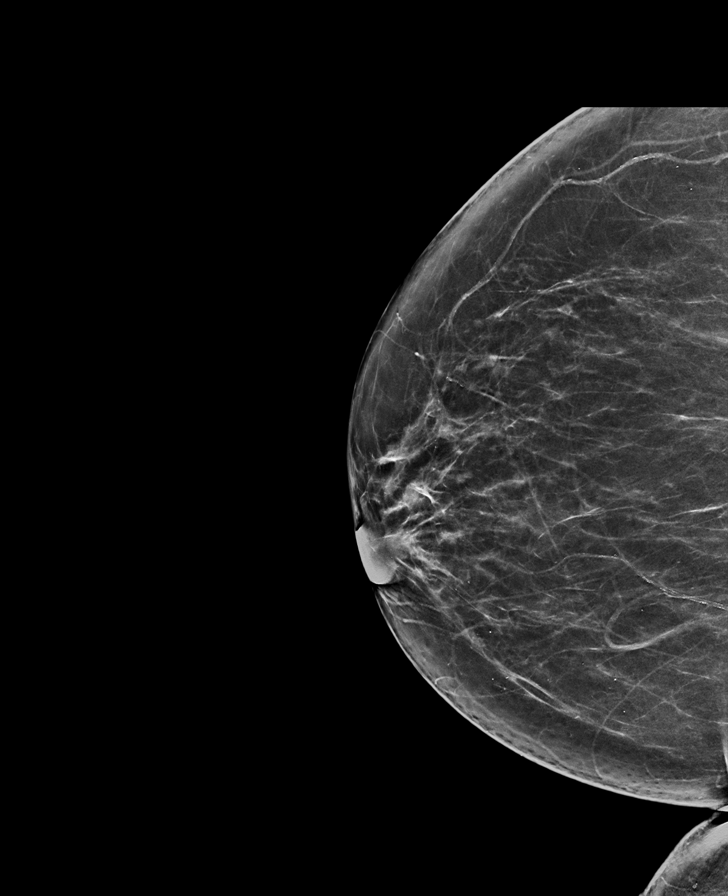

[L MLO synth-2D]
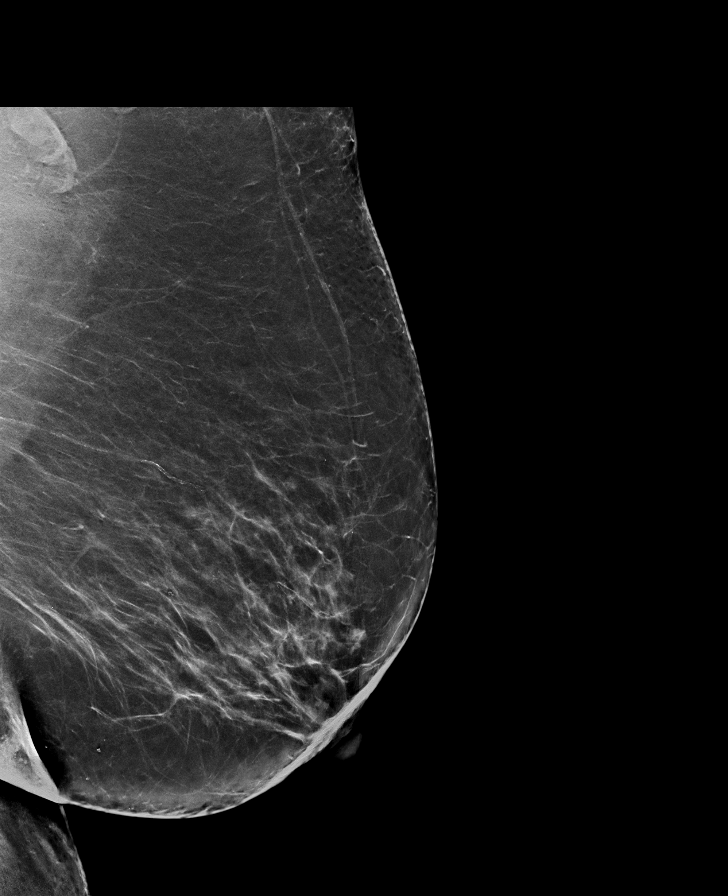

[R MLO synth-2D]
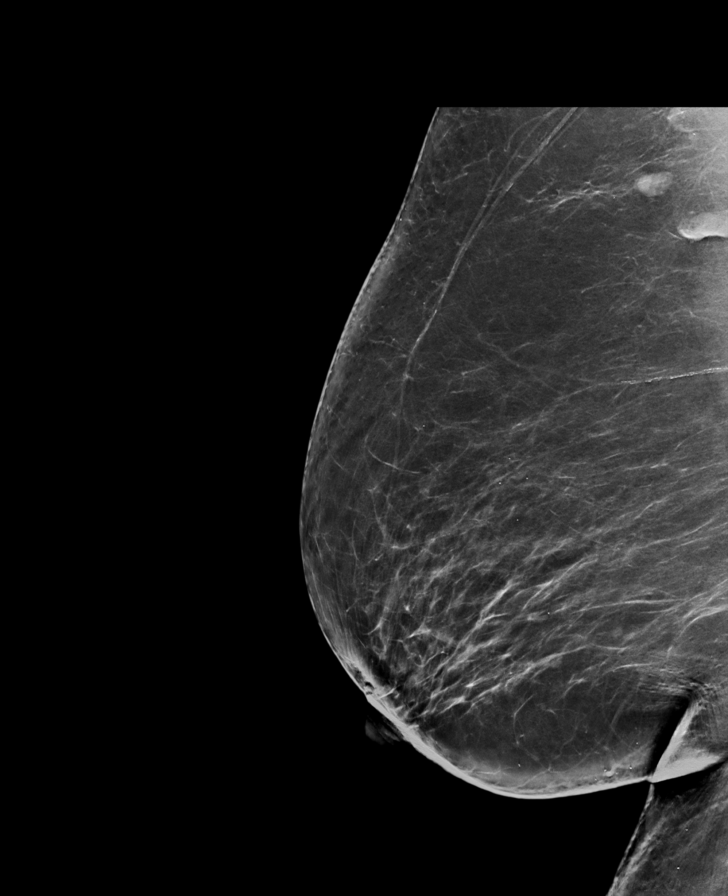

[L CC tomo · tomo slice 37/72.0]
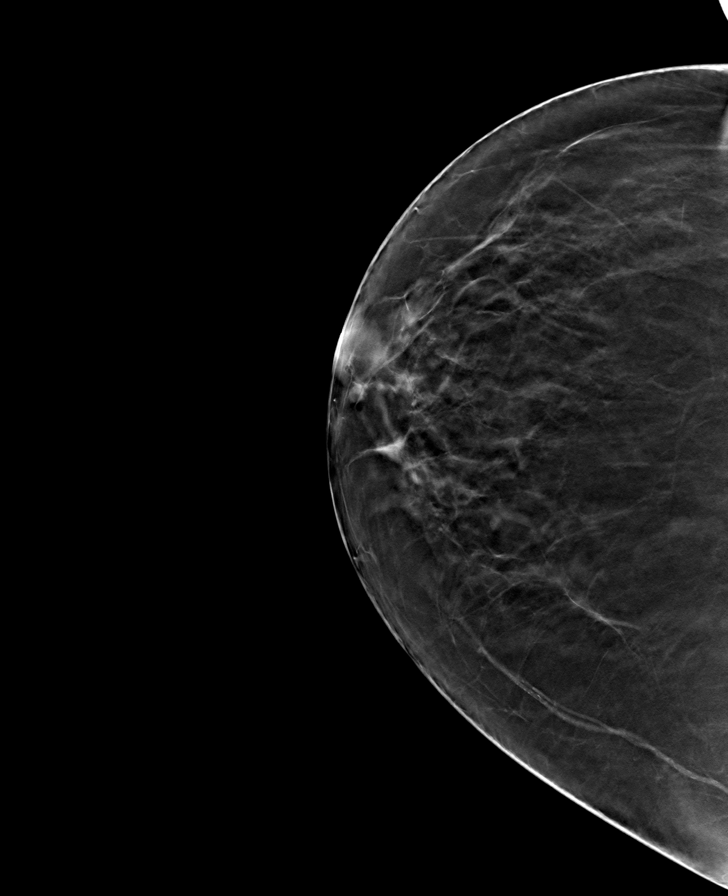

[R CC tomo · tomo slice 35/69.0]
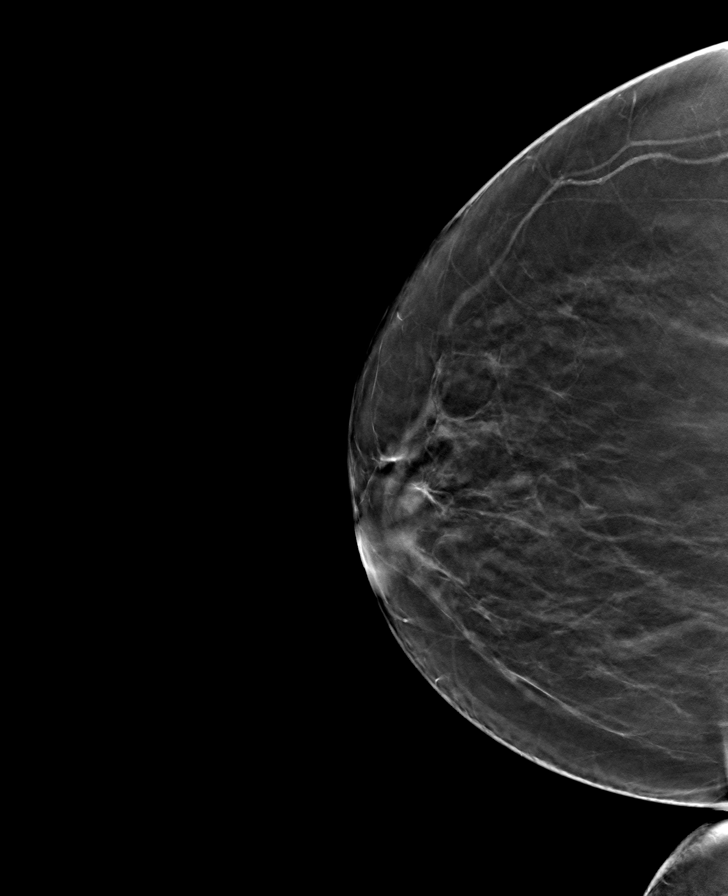

[L MLO tomo · tomo slice 44/87.0]
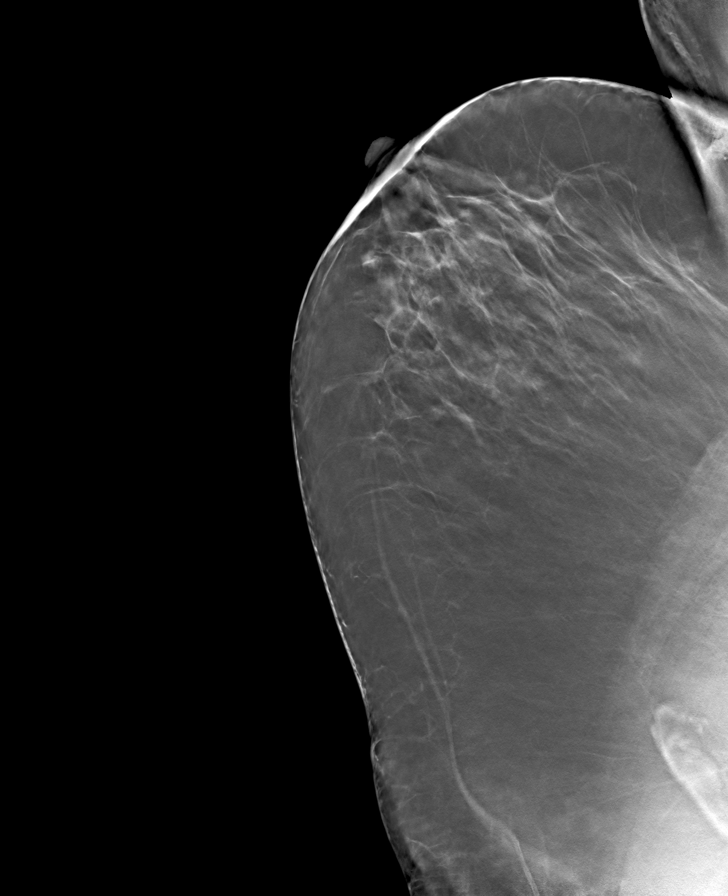

[R MLO tomo · tomo slice 43/85.0]
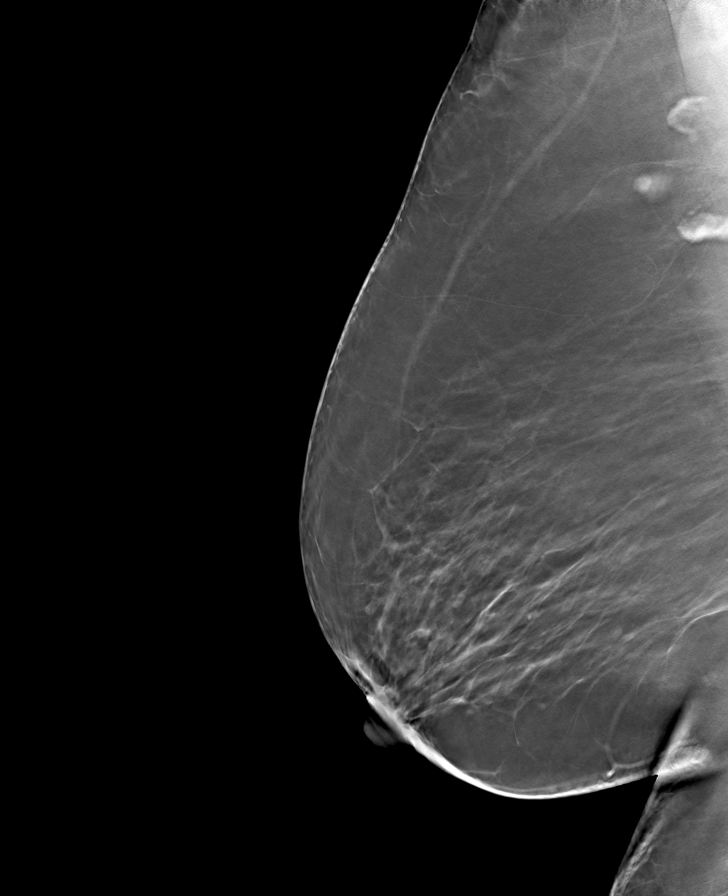

[8 of 24 positions shown; findings below may reference images not displayed]

ACR Breast Density Category b: There are scattered areas of
fibroglandular density.
FINDINGS: There are no findings suspicious for malignancy.
IMPRESSION: No mammographic evidence of malignancy. A result letter of this
screening mammogram will be mailed directly to the patient.

RECOMMENDATION:
Screening mammogram in one year. (Code:51-O-LD2)

BI-RADS CATEGORY  1: Negative.

## 2023-11-13 DIAGNOSIS — E782 Mixed hyperlipidemia: Secondary | ICD-10-CM | POA: Diagnosis not present

## 2023-11-13 DIAGNOSIS — E66812 Obesity, class 2: Secondary | ICD-10-CM | POA: Diagnosis not present

## 2023-11-13 DIAGNOSIS — I10 Essential (primary) hypertension: Secondary | ICD-10-CM | POA: Diagnosis not present

## 2023-11-13 DIAGNOSIS — E538 Deficiency of other specified B group vitamins: Secondary | ICD-10-CM | POA: Diagnosis not present

## 2023-11-13 DIAGNOSIS — K219 Gastro-esophageal reflux disease without esophagitis: Secondary | ICD-10-CM | POA: Diagnosis not present

## 2023-11-13 DIAGNOSIS — R7303 Prediabetes: Secondary | ICD-10-CM | POA: Diagnosis not present

## 2023-11-13 DIAGNOSIS — Z79899 Other long term (current) drug therapy: Secondary | ICD-10-CM | POA: Diagnosis not present

## 2023-12-12 DIAGNOSIS — R7303 Prediabetes: Secondary | ICD-10-CM | POA: Diagnosis not present

## 2023-12-12 DIAGNOSIS — E782 Mixed hyperlipidemia: Secondary | ICD-10-CM | POA: Diagnosis not present

## 2023-12-12 DIAGNOSIS — Z79899 Other long term (current) drug therapy: Secondary | ICD-10-CM | POA: Diagnosis not present

## 2023-12-15 DIAGNOSIS — I10 Essential (primary) hypertension: Secondary | ICD-10-CM | POA: Diagnosis not present

## 2024-01-05 DIAGNOSIS — Z008 Encounter for other general examination: Secondary | ICD-10-CM | POA: Diagnosis not present

## 2024-01-05 DIAGNOSIS — E785 Hyperlipidemia, unspecified: Secondary | ICD-10-CM | POA: Diagnosis not present

## 2024-01-05 DIAGNOSIS — Z6836 Body mass index (BMI) 36.0-36.9, adult: Secondary | ICD-10-CM | POA: Diagnosis not present

## 2024-01-05 DIAGNOSIS — E1169 Type 2 diabetes mellitus with other specified complication: Secondary | ICD-10-CM | POA: Diagnosis not present

## 2024-04-16 ENCOUNTER — Other Ambulatory Visit: Payer: Self-pay | Admitting: Internal Medicine

## 2024-04-16 DIAGNOSIS — Z1231 Encounter for screening mammogram for malignant neoplasm of breast: Secondary | ICD-10-CM

## 2024-07-26 ENCOUNTER — Encounter

## 2024-08-04 ENCOUNTER — Ambulatory Visit
Admission: RE | Admit: 2024-08-04 | Discharge: 2024-08-04 | Disposition: A | Source: Ambulatory Visit | Attending: Internal Medicine | Admitting: Internal Medicine

## 2024-08-04 DIAGNOSIS — Z1231 Encounter for screening mammogram for malignant neoplasm of breast: Secondary | ICD-10-CM
